# Patient Record
Sex: Female | Born: 1995 | Race: White | Hispanic: No | Marital: Single | Smoking: Never smoker
Health system: Southern US, Community
[De-identification: ages and names within clinical notes are randomized; demographics above are authoritative.]

---
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
## 2013-08-20 ENCOUNTER — Ambulatory Visit (INDEPENDENT_AMBULATORY_CARE_PROVIDER_SITE_OTHER): Payer: 59 | Admitting: Podiatry

## 2013-08-20 ENCOUNTER — Ambulatory Visit (INDEPENDENT_AMBULATORY_CARE_PROVIDER_SITE_OTHER): Payer: 59

## 2013-08-20 ENCOUNTER — Encounter: Payer: Self-pay | Admitting: Podiatry

## 2013-08-20 VITALS — BP 109/66 | HR 60 | Temp 98.5°F | Resp 16

## 2013-08-20 DIAGNOSIS — M79674 Pain in right toe(s): Secondary | ICD-10-CM

## 2013-08-20 DIAGNOSIS — L02619 Cutaneous abscess of unspecified foot: Secondary | ICD-10-CM

## 2013-08-20 DIAGNOSIS — L03039 Cellulitis of unspecified toe: Secondary | ICD-10-CM

## 2013-08-20 DIAGNOSIS — M79609 Pain in unspecified limb: Secondary | ICD-10-CM

## 2013-08-20 MED ORDER — AMOXICILLIN 400 MG/5ML PO SUSR: 400.0000 mg | Freq: Two times a day (BID) | ORAL | Status: AC

## 2013-08-20 NOTE — Progress Notes (Signed)
Subjective:     Patient ID: Virginia Miller, female   DOB: 09/07/1995, 18 y.o.   MRN: [id number]  Toe Pain    patient presents with mother stating that she thinks she stepped on a small splinter 5 days ago which she got out and that she will buildup yesterday and she had a blister on top of the second toe of her right foot. She feels that she has lymph nodes are distended in her groin and in her right arm Pit and gives no history of running fever or systemic signs of infection   Review of Systems  All other systems reviewed and are negative.      Objective:   Physical Exam  Nursing note and vitals reviewed. Constitutional: She is oriented to person, place, and time. She appears well-developed.  Cardiovascular: Intact distal pulses.   Musculoskeletal: Normal range of motion.  Neurological: She is oriented to person, place, and time.  Skin: Skin is warm.   neurovascular status is intact muscle strength is adequate and I noted that there is normal range of motion subtalar midtarsal joint. There is a small portal entry in the right plantar foot near the second metatarsal with no erythema edema or drainage around it and there is a small amount of redness in the distal interphalangeal joint of the second digit right with no proximal edema erythema noted no edema erythema or calor noted proximal foot or distal foot or lower leg at this time.     Assessment:     I believe advance are unrelated given the fact there is no erythema edema or drainage noted but as a precautionary measure I do want her taking her temperature every day and if she should develop any chills I want her to take her temperature immediately    Plan:     H&P performed x-ray reviewed and spent a great deal of time going over the condition and what to do if any systemic signs of infection should occur. As a precautionary measure I did place her on amoxicillin suspension and we will see how she does and I advised if her lymph  nodes remains swollen with no other issues to see her family physician in the next few days. If she should develop any systemic signs of infection or redness or swelling occurring from the second toe underneath the right foot that she is to go straight to the emergency room for probable IV antibiotic treatment. Strict instructions given to be very serious about this if anything should occur and she does not respond antibiotics in the next few days

## 2013-08-20 NOTE — Progress Notes (Signed)
   Subjective:    Patient ID: Virginia Miller, female    DOB: 02/20/1996, 18 y.o.   MRN: [id number]  HPI Comments: "My toe is red and swollen"  Patient c/o tender 2nd toe right since Tuesday (2 days ago). She states that she thinks she stepped on a splinter on Saturday (5 days ago). She states that she did get the splinter out and then Tuesday her toe got a blister. This morning woke up and it was red and swollen. She has been using alcohol and soaking. She says that her lymph node is swollen in her groin and under her arm on right side.   She is currently working as a Public relations account executivelifeguard.  Toe Pain       Review of Systems  Constitutional: Positive for appetite change and fatigue.  Skin: Positive for wound.  Hematological: Positive for adenopathy.  All other systems reviewed and are negative.      Objective:   Physical Exam        Assessment & Plan:
